# Patient Record
Sex: Female | Born: 1980 | Race: Black or African American | Hispanic: No | Marital: Single | Smoking: Never smoker
Health system: Southern US, Community
[De-identification: ages and names within clinical notes are randomized; demographics above are authoritative.]

## PROBLEM LIST (undated history)

## (undated) DIAGNOSIS — F419 Anxiety disorder, unspecified: Secondary | ICD-10-CM

## (undated) DIAGNOSIS — F32A Depression, unspecified: Secondary | ICD-10-CM

## (undated) DIAGNOSIS — G510 Bell's palsy: Secondary | ICD-10-CM

## (undated) DIAGNOSIS — D649 Anemia, unspecified: Secondary | ICD-10-CM

## (undated) DIAGNOSIS — M797 Fibromyalgia: Secondary | ICD-10-CM

## (undated) DIAGNOSIS — F329 Major depressive disorder, single episode, unspecified: Secondary | ICD-10-CM

## (undated) DIAGNOSIS — I517 Cardiomegaly: Secondary | ICD-10-CM

---
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
## 2010-01-04 ENCOUNTER — Emergency Department (HOSPITAL_BASED_OUTPATIENT_CLINIC_OR_DEPARTMENT_OTHER): Admission: EM | Admit: 2010-01-04 | Discharge: 2010-01-04 | Payer: Self-pay | Admitting: Emergency Medicine

## 2010-01-04 ENCOUNTER — Ambulatory Visit: Payer: Self-pay | Admitting: Diagnostic Radiology

## 2010-02-15 ENCOUNTER — Emergency Department (HOSPITAL_BASED_OUTPATIENT_CLINIC_OR_DEPARTMENT_OTHER): Admission: EM | Admit: 2010-02-15 | Discharge: 2010-02-16 | Payer: Self-pay | Admitting: Emergency Medicine

## 2010-05-06 ENCOUNTER — Emergency Department (HOSPITAL_COMMUNITY): Admission: EM | Admit: 2010-05-06 | Discharge: 2010-05-06 | Payer: Self-pay | Admitting: Emergency Medicine

## 2010-06-21 ENCOUNTER — Emergency Department (HOSPITAL_COMMUNITY): Admission: EM | Admit: 2010-06-21 | Discharge: 2010-06-21 | Payer: Self-pay | Admitting: Emergency Medicine

## 2010-07-26 ENCOUNTER — Emergency Department (HOSPITAL_COMMUNITY): Admission: EM | Admit: 2010-07-26 | Discharge: 2010-07-27 | Payer: Self-pay | Admitting: Emergency Medicine

## 2010-12-03 ENCOUNTER — Inpatient Hospital Stay (HOSPITAL_COMMUNITY)
Admission: RE | Admit: 2010-12-03 | Discharge: 2010-12-06 | DRG: 885 | Disposition: A | Discharge status: Home / Self Care | Payer: Medicaid Other | Attending: Psychiatry | Admitting: Psychiatry

## 2010-12-03 DIAGNOSIS — R45851 Suicidal ideations: Secondary | ICD-10-CM

## 2010-12-03 DIAGNOSIS — F322 Major depressive disorder, single episode, severe without psychotic features: Principal | ICD-10-CM

## 2010-12-03 DIAGNOSIS — IMO0001 Reserved for inherently not codable concepts without codable children: Secondary | ICD-10-CM

## 2010-12-03 DIAGNOSIS — E669 Obesity, unspecified: Secondary | ICD-10-CM

## 2010-12-03 DIAGNOSIS — G4700 Insomnia, unspecified: Secondary | ICD-10-CM

## 2010-12-04 LAB — CBC
HCT: 34.2 % — ABNORMAL LOW (ref 36.0–46.0)
Hemoglobin: 11.3 g/dL — ABNORMAL LOW (ref 12.0–15.0)
RBC: 4.15 MIL/uL (ref 3.87–5.11)
WBC: 6.3 10*3/uL (ref 4.0–10.5)

## 2010-12-04 LAB — URINALYSIS, ROUTINE W REFLEX MICROSCOPIC
Hgb urine dipstick: NEGATIVE
Specific Gravity, Urine: 1.031 — ABNORMAL HIGH (ref 1.005–1.030)
Urine Glucose, Fasting: NEGATIVE mg/dL
pH: 7 (ref 5.0–8.0)

## 2010-12-04 LAB — COMPREHENSIVE METABOLIC PANEL
ALT: 11 U/L (ref 0–35)
AST: 17 U/L (ref 0–37)
Alkaline Phosphatase: 77 U/L (ref 39–117)
CO2: 26 mEq/L (ref 19–32)
GFR calc Af Amer: >60 mL/min (ref >60)
GFR calc non Af Amer: >60 mL/min (ref >60)
Glucose, Bld: 107 mg/dL — ABNORMAL HIGH (ref 70–99)
Potassium: 3.7 mEq/L (ref 3.5–5.1)
Sodium: 138 mEq/L (ref 135–145)

## 2010-12-04 LAB — PREGNANCY, URINE: Preg Test, Ur: NEGATIVE

## 2010-12-05 DIAGNOSIS — F39 Unspecified mood [affective] disorder: Secondary | ICD-10-CM

## 2010-12-05 LAB — URINE DRUGS OF ABUSE SCREEN W ALC, ROUTINE (REF LAB)
Barbiturate Quant, Ur: NEGATIVE
Benzodiazepines.: NEGATIVE
Ethyl Alcohol: <10 mg/dL (ref <10)
Marijuana Metabolite: NEGATIVE
Opiate Screen, Urine: NEGATIVE
Phencyclidine (PCP): NEGATIVE
Propoxyphene: NEGATIVE

## 2010-12-05 LAB — TSH: TSH: 0.619 u[IU]/mL (ref 0.350–4.500)

## 2010-12-06 LAB — FOLATE: Folate: 9.5 ng/mL

## 2010-12-09 NOTE — Discharge Summary (Signed)
  Joann Peters, Joann Peters               ACCOUNT NO.:  [id number]  MEDICAL RECORD NO.:  [id number]           PATIENT TYPE:  LOCATION:  0507                          FACILITY:  [facility]  PHYSICIAN:  Marlis Edelson, DO        DATE OF BIRTH:  Jun 24, 1981  DATE OF ADMISSION:  12/03/2010 DATE OF DISCHARGE:  12/06/2010                              DISCHARGE SUMMARY   REASON FOR ADMISSION:  The patient reported with depressive symptoms of suicidal thoughts, feeling very tired, not wanting to live, just tired of life, not sleeping, losing her appetite.  Had been on antidepressants at home. Trying to be independent. Her significant stressors raising 6 children on her own and trying to go to school.  One of her children has a history of autism.  FINAL DIAGNOSES:  AXIS I:  Major depressive disorder, severe. AXIS II:  Deferred. AXIS III:  Fibromyalgia. AXIS IV:  Problems with education, psychosocial stressors. AXIS V:  55-60.  LABORATORY DATA:  Shows a folate level of 9.5, vitamin B12 269, total iron 9.5.  UDS is negative.  TSH of 0.619.  CMP within normal limits. CBC shows a hemoglobin of 11.3, hematocrit of 34.2.  Pregnancy test is negative.  CONDITION OF PATIENT:  The patient was integrated to the adult milieu. We started her back on her Cymbalta 30 mg and reinforced med compliance. Also increased her BuSpar 10 mg t.i.d. which she was already receiving some benefit.  We encouraged groups.  We added trazodone for sleep as the patient was having difficulty with insomnia.  Monitored her lab work.  Counselor spoke with patient and discussed support systems.  The patient improved and was denying any suicidal thoughts. She felt more hopeful.  Realized that she had more support than  she realized.  CONDITION ON DISCHARGE:  The patient was fully alert and cooperative with good eye contact, coherent, asking appropriate questions.  Felt that she would initially stay with her mother with continued support  and take advantage of her support that offered help with her children.  She was doing well on her medications.  She showed no signs of mania, hypomania or overt anxiety. Denied any suicidal, homicidal or psychotic symptoms.  DISCHARGE MEDICATIONS: 1. Cymbalta 30 mg one daily. 2. BuSpar 10 mg one tablet t.i.d. 3. Ambien for sleep. 4. Ferrous sulfate 325 one tablet daily.  DISCHARGE INSTRUCTIONS: 1. Her follow up appointment is at the [facility] on Thursday,     December 10, 2010 at 2 p.m. 2. Has counseling at the [facility] on Tuesday, December 17, 2010 at     phone number [phone]. 3. The patient was advised if any thoughts of suicidal or homicidal     ideation, or psychotic symptoms, to return to the emergency     department.     Landry Corporal, N.P.   ______________________________ Marlis Edelson, DO    JO/MEDQ  D:  12/06/2010  T:  12/06/2010  Job:  [id number]  Electronically Signed by Limmie PatriciaP. on 12/09/2010 02:02:26 PM Electronically Signed by Marlis Edelson MD on 12/09/2010 08:10:26 PM

## 2010-12-25 LAB — RAPID STREP SCREEN (MED CTR MEBANE ONLY): Streptococcus, Group A Screen (Direct): NEGATIVE

## 2010-12-26 LAB — URINALYSIS, ROUTINE W REFLEX MICROSCOPIC
Bilirubin Urine: NEGATIVE
Glucose, UA: NEGATIVE mg/dL
Hgb urine dipstick: NEGATIVE
Ketones, ur: NEGATIVE mg/dL
Nitrite: NEGATIVE
Protein, ur: NEGATIVE mg/dL
Specific Gravity, Urine: 1.018 (ref 1.005–1.030)
Urobilinogen, UA: 0.2 mg/dL (ref 0.0–1.0)
pH: 6 (ref 5.0–8.0)

## 2010-12-26 LAB — POCT PREGNANCY, URINE: Preg Test, Ur: NEGATIVE

## 2010-12-31 LAB — URINALYSIS, ROUTINE W REFLEX MICROSCOPIC
Ketones, ur: 15 mg/dL — AB
Nitrite: NEGATIVE
Protein, ur: NEGATIVE mg/dL
Urobilinogen, UA: 1 mg/dL (ref 0.0–1.0)

## 2010-12-31 LAB — URINE MICROSCOPIC-ADD ON

## 2010-12-31 LAB — PREGNANCY, URINE: Preg Test, Ur: NEGATIVE

## 2011-01-06 LAB — BASIC METABOLIC PANEL
CO2: 28 mEq/L (ref 19–32)
Calcium: 8.9 mg/dL (ref 8.4–10.5)
Chloride: 109 mEq/L (ref 96–112)
GFR calc Af Amer: >60 mL/min (ref >60)
Glucose, Bld: 94 mg/dL (ref 70–99)
Potassium: 3.6 mEq/L (ref 3.5–5.1)
Sodium: 144 mEq/L (ref 135–145)

## 2011-01-06 LAB — DIFFERENTIAL
Basophils Relative: 1 % (ref 0–1)
Eosinophils Relative: 1 % (ref 0–5)
Monocytes Absolute: 0.4 10*3/uL (ref 0.1–1.0)
Monocytes Relative: 6 % (ref 3–12)
Neutro Abs: 3.7 10*3/uL (ref 1.7–7.7)

## 2011-01-06 LAB — CBC
HCT: 33.3 % — ABNORMAL LOW (ref 36.0–46.0)
Hemoglobin: 11.7 g/dL — ABNORMAL LOW (ref 12.0–15.0)
MCHC: 35.2 g/dL (ref 30.0–36.0)
RBC: 4.02 MIL/uL (ref 3.87–5.11)

## 2011-02-05 ENCOUNTER — Emergency Department (INDEPENDENT_AMBULATORY_CARE_PROVIDER_SITE_OTHER): Payer: Medicaid Other

## 2011-02-05 ENCOUNTER — Emergency Department (HOSPITAL_BASED_OUTPATIENT_CLINIC_OR_DEPARTMENT_OTHER)
Admission: EM | Admit: 2011-02-05 | Discharge: 2011-02-05 | Disposition: A | Discharge status: Home / Self Care | Payer: Medicaid Other | Attending: Emergency Medicine | Admitting: Emergency Medicine

## 2011-02-05 DIAGNOSIS — M79609 Pain in unspecified limb: Secondary | ICD-10-CM

## 2011-02-05 DIAGNOSIS — N760 Acute vaginitis: Secondary | ICD-10-CM | POA: Insufficient documentation

## 2011-02-05 DIAGNOSIS — A499 Bacterial infection, unspecified: Secondary | ICD-10-CM | POA: Insufficient documentation

## 2011-02-05 DIAGNOSIS — B9689 Other specified bacterial agents as the cause of diseases classified elsewhere: Secondary | ICD-10-CM | POA: Insufficient documentation

## 2011-02-05 DIAGNOSIS — Y92009 Unspecified place in unspecified non-institutional (private) residence as the place of occurrence of the external cause: Secondary | ICD-10-CM | POA: Insufficient documentation

## 2011-02-05 DIAGNOSIS — N390 Urinary tract infection, site not specified: Secondary | ICD-10-CM | POA: Insufficient documentation

## 2011-02-05 DIAGNOSIS — W1809XA Striking against other object with subsequent fall, initial encounter: Secondary | ICD-10-CM | POA: Insufficient documentation

## 2011-02-05 DIAGNOSIS — W010XXA Fall on same level from slipping, tripping and stumbling without subsequent striking against object, initial encounter: Secondary | ICD-10-CM

## 2011-02-05 LAB — URINE MICROSCOPIC-ADD ON

## 2011-02-05 LAB — WET PREP, GENITAL
Trich, Wet Prep: NONE SEEN
Yeast Wet Prep HPF POC: NONE SEEN

## 2011-02-05 LAB — URINALYSIS, ROUTINE W REFLEX MICROSCOPIC
Bilirubin Urine: NEGATIVE
Glucose, UA: NEGATIVE mg/dL
Hgb urine dipstick: NEGATIVE
Protein, ur: NEGATIVE mg/dL
Specific Gravity, Urine: 1.031 — ABNORMAL HIGH (ref 1.005–1.030)

## 2011-02-05 LAB — PREGNANCY, URINE: Preg Test, Ur: NEGATIVE

## 2011-02-06 LAB — GC/CHLAMYDIA PROBE AMP, GENITAL: GC Probe Amp, Genital: NEGATIVE

## 2011-02-07 ENCOUNTER — Emergency Department (INDEPENDENT_AMBULATORY_CARE_PROVIDER_SITE_OTHER): Payer: Medicaid Other

## 2011-02-07 ENCOUNTER — Emergency Department (HOSPITAL_BASED_OUTPATIENT_CLINIC_OR_DEPARTMENT_OTHER)
Admission: EM | Admit: 2011-02-07 | Discharge: 2011-02-07 | Disposition: A | Discharge status: Home / Self Care | Payer: Medicaid Other | Attending: Emergency Medicine | Admitting: Emergency Medicine

## 2011-02-07 DIAGNOSIS — N390 Urinary tract infection, site not specified: Secondary | ICD-10-CM | POA: Insufficient documentation

## 2011-02-07 DIAGNOSIS — R0602 Shortness of breath: Secondary | ICD-10-CM | POA: Insufficient documentation

## 2011-02-07 DIAGNOSIS — A499 Bacterial infection, unspecified: Secondary | ICD-10-CM | POA: Insufficient documentation

## 2011-02-07 DIAGNOSIS — R079 Chest pain, unspecified: Secondary | ICD-10-CM

## 2011-02-07 DIAGNOSIS — F329 Major depressive disorder, single episode, unspecified: Secondary | ICD-10-CM | POA: Insufficient documentation

## 2011-02-07 DIAGNOSIS — B9689 Other specified bacterial agents as the cause of diseases classified elsewhere: Secondary | ICD-10-CM | POA: Insufficient documentation

## 2011-02-07 DIAGNOSIS — F3289 Other specified depressive episodes: Secondary | ICD-10-CM | POA: Insufficient documentation

## 2011-02-07 DIAGNOSIS — N760 Acute vaginitis: Secondary | ICD-10-CM | POA: Insufficient documentation

## 2011-04-10 ENCOUNTER — Emergency Department (HOSPITAL_BASED_OUTPATIENT_CLINIC_OR_DEPARTMENT_OTHER): Payer: Medicaid Other

## 2011-04-10 ENCOUNTER — Emergency Department (HOSPITAL_BASED_OUTPATIENT_CLINIC_OR_DEPARTMENT_OTHER)
Admission: EM | Admit: 2011-04-10 | Discharge: 2011-04-10 | Discharge status: Against Medical Advice | Payer: Medicaid Other | Attending: Emergency Medicine | Admitting: Emergency Medicine

## 2011-04-10 DIAGNOSIS — N898 Other specified noninflammatory disorders of vagina: Secondary | ICD-10-CM | POA: Insufficient documentation

## 2011-04-10 LAB — PREGNANCY, URINE: Preg Test, Ur: NEGATIVE

## 2011-04-10 LAB — WET PREP, GENITAL: WBC, Wet Prep HPF POC: NONE SEEN

## 2011-04-11 LAB — GC/CHLAMYDIA PROBE AMP, GENITAL
Chlamydia, DNA Probe: NEGATIVE
GC Probe Amp, Genital: NEGATIVE

## 2011-04-14 ENCOUNTER — Emergency Department (INDEPENDENT_AMBULATORY_CARE_PROVIDER_SITE_OTHER): Payer: Medicaid Other

## 2011-04-14 ENCOUNTER — Emergency Department (HOSPITAL_BASED_OUTPATIENT_CLINIC_OR_DEPARTMENT_OTHER)
Admission: EM | Admit: 2011-04-14 | Discharge: 2011-04-14 | Disposition: A | Discharge status: Home / Self Care | Payer: Medicaid Other | Attending: Emergency Medicine | Admitting: Emergency Medicine

## 2011-04-14 DIAGNOSIS — N898 Other specified noninflammatory disorders of vagina: Secondary | ICD-10-CM

## 2011-04-14 DIAGNOSIS — R109 Unspecified abdominal pain: Secondary | ICD-10-CM | POA: Insufficient documentation

## 2011-04-14 LAB — URINALYSIS, ROUTINE W REFLEX MICROSCOPIC
Bilirubin Urine: NEGATIVE
Glucose, UA: NEGATIVE mg/dL
Hgb urine dipstick: NEGATIVE
Protein, ur: NEGATIVE mg/dL
Urobilinogen, UA: 0.2 mg/dL (ref 0.0–1.0)

## 2011-09-11 ENCOUNTER — Emergency Department (HOSPITAL_BASED_OUTPATIENT_CLINIC_OR_DEPARTMENT_OTHER)
Admission: EM | Admit: 2011-09-11 | Discharge: 2011-09-11 | Discharge status: Against Medical Advice | Payer: Medicaid Other | Attending: Emergency Medicine | Admitting: Emergency Medicine

## 2011-09-11 ENCOUNTER — Encounter: Payer: Self-pay | Admitting: *Deleted

## 2011-09-11 DIAGNOSIS — N920 Excessive and frequent menstruation with regular cycle: Secondary | ICD-10-CM | POA: Insufficient documentation

## 2011-09-11 HISTORY — DX: Anemia, unspecified: D64.9

## 2011-09-11 NOTE — ED Notes (Signed)
Two periods this month lasting 7 to 8 days each states she is using 5-7 pads per day has been seen for same before

## 2012-07-30 ENCOUNTER — Emergency Department (HOSPITAL_BASED_OUTPATIENT_CLINIC_OR_DEPARTMENT_OTHER)
Admission: EM | Admit: 2012-07-30 | Discharge: 2012-07-30 | Disposition: A | Discharge status: Home / Self Care | Payer: Self-pay | Attending: Emergency Medicine | Admitting: Emergency Medicine

## 2012-07-30 ENCOUNTER — Encounter (HOSPITAL_BASED_OUTPATIENT_CLINIC_OR_DEPARTMENT_OTHER): Payer: Self-pay | Admitting: *Deleted

## 2012-07-30 DIAGNOSIS — N946 Dysmenorrhea, unspecified: Secondary | ICD-10-CM | POA: Insufficient documentation

## 2012-07-30 DIAGNOSIS — B9689 Other specified bacterial agents as the cause of diseases classified elsewhere: Secondary | ICD-10-CM | POA: Insufficient documentation

## 2012-07-30 DIAGNOSIS — N760 Acute vaginitis: Secondary | ICD-10-CM | POA: Insufficient documentation

## 2012-07-30 DIAGNOSIS — N920 Excessive and frequent menstruation with regular cycle: Secondary | ICD-10-CM | POA: Insufficient documentation

## 2012-07-30 DIAGNOSIS — A499 Bacterial infection, unspecified: Secondary | ICD-10-CM | POA: Insufficient documentation

## 2012-07-30 LAB — CBC
HCT: 31.6 % — ABNORMAL LOW (ref 36.0–46.0)
Hemoglobin: 11 g/dL — ABNORMAL LOW (ref 12.0–15.0)
MCH: 27.9 pg (ref 26.0–34.0)
MCHC: 34.8 g/dL (ref 30.0–36.0)
RBC: 3.94 MIL/uL (ref 3.87–5.11)

## 2012-07-30 LAB — URINALYSIS, ROUTINE W REFLEX MICROSCOPIC
Glucose, UA: NEGATIVE mg/dL
Ketones, ur: NEGATIVE mg/dL
Protein, ur: NEGATIVE mg/dL
pH: 6 (ref 5.0–8.0)

## 2012-07-30 LAB — WET PREP, GENITAL
Trich, Wet Prep: NONE SEEN
Yeast Wet Prep HPF POC: NONE SEEN

## 2012-07-30 LAB — PREGNANCY, URINE: Preg Test, Ur: NEGATIVE

## 2012-07-30 LAB — URINE MICROSCOPIC-ADD ON

## 2012-07-30 MED ORDER — MEDROXYPROGESTERONE ACETATE 5 MG PO TABS
5.0000 mg | ORAL_TABLET | Freq: Every day | ORAL | Status: AC
Start: 2012-07-30 — End: ?

## 2012-07-30 MED ORDER — HYDROCODONE-ACETAMINOPHEN 5-325 MG PO TABS: 1.0000 | ORAL_TABLET | Freq: Four times a day (QID) | ORAL | Status: AC | PRN

## 2012-07-30 MED ORDER — IBUPROFEN 800 MG PO TABS
800.0000 mg | ORAL_TABLET | Freq: Once | ORAL | Status: AC
  Administered 2012-07-30: 800 mg via ORAL
  Filled 2012-07-30: qty 1

## 2012-07-30 MED ORDER — IBUPROFEN 800 MG PO TABS: 800.0000 mg | ORAL_TABLET | Freq: Three times a day (TID) | ORAL | Status: AC | PRN

## 2012-07-30 NOTE — ED Notes (Signed)
MD at bedside giving test results and dispo plan.  

## 2012-07-30 NOTE — ED Notes (Signed)
Started period yesterday having severe pain in legs back and low abdomen with clots reports has been very tired for 2 days and heavy bleeding states used 4 pads today

## 2012-07-30 NOTE — ED Notes (Signed)
MD at bedside. 

## 2012-07-30 NOTE — ED Notes (Signed)
Patient unable to provide urine specimen at this time. Patient in room 7, undressed, and given instructions when she has to go to the restroom

## 2012-07-30 NOTE — ED Provider Notes (Signed)
History     CSN: [id number]  Arrival date & time 07/30/12  1847   First MD Initiated Contact with Patient 07/30/12 1851      Chief Complaint  Patient presents with  . menstrual cramps     (Consider location/radiation/quality/duration/timing/severity/associated sxs/prior treatment) HPI Pt with history of dysmenorrhea, has been evaluated in the ED several times previously for same including US done about 15 months ago which was neg. Reports she began having some cramping abdomen and back pain yesterday prior to the onset of her menses today. Reports she has been having heavy vaginal bleeding, severe lower abdominal cramping. No fever. Some nausea no vomiting. No dysuria.  Past Medical History  Diagnosis Date  . Anemia     History reviewed. No pertinent past surgical history.  History reviewed. No pertinent family history.  History  Substance Use Topics  . Smoking status: Never Smoker   . Smokeless tobacco: Not on file  . Alcohol Use: No    OB History    Grav Para Term Preterm Abortions TAB SAB Ect Mult Living                  Review of Systems All other systems reviewed and are negative except as noted in HPI.   Allergies  Percocet  Home Medications  No current outpatient prescriptions on file.  BP 123/85  Pulse 71  Temp 98.2 F (36.8 C) (Oral)  Resp 16  Ht 5\' 2"  (1.575 m)  Wt 205 lb (92.987 kg)  BMI 37.49 kg/m2  SpO2 100%  Physical Exam  Nursing note and vitals reviewed. Constitutional: She is oriented to person, place, and time. She appears well-developed and well-nourished.  HENT:  Head: Normocephalic and atraumatic.  Eyes: EOM are normal. Pupils are equal, round, and reactive to light.  Neck: Normal range of motion. Neck supple.  Cardiovascular: Normal rate, normal heart sounds and intact distal pulses.   Pulmonary/Chest: Effort normal and breath sounds normal.  Abdominal: Bowel sounds are normal. She exhibits no distension and no mass. There is  tenderness (diffuse lower abdominal tenderness). There is no rebound and no guarding.  Genitourinary: Uterus is tender. Uterus is not enlarged. Cervix exhibits no motion tenderness and no discharge. Right adnexum displays no tenderness and no fullness. Left adnexum displays no tenderness and no fullness. There is bleeding around the vagina. No foreign body around the vagina. No vaginal discharge found.  Musculoskeletal: Normal range of motion. She exhibits no edema and no tenderness.  Neurological: She is alert and oriented to person, place, and time. She has normal strength. No cranial nerve deficit or sensory deficit.  Skin: Skin is warm and dry. No rash noted.  Psychiatric: She has a normal mood and affect.    ED Course  Procedures (including critical care time)  Labs Reviewed  URINALYSIS, ROUTINE W REFLEX MICROSCOPIC - Abnormal; Notable for the following:    APPearance CLOUDY (*)     Hgb urine dipstick LARGE (*)     All other components within normal limits  CBC - Abnormal; Notable for the following:    Hemoglobin 11.0 (*)     HCT 31.6 (*)     All other components within normal limits  WET PREP, GENITAL - Abnormal; Notable for the following:    Clue Cells Wet Prep HPF POC MODERATE (*)     WBC, Wet Prep HPF POC RARE (*)     All other components within normal limits  URINE MICROSCOPIC-ADD ON - Abnormal;  Notable for the following:    Squamous Epithelial / LPF FEW (*)     All other components within normal limits  PREGNANCY, URINE  GC/CHLAMYDIA PROBE AMP, GENITAL   No results found.   No diagnosis found.    MDM  Pt with dysmenorrhea and menorrhagia. Also BV on wet prep. Will treat with Provera, pain meds as needed. Flagyl and Gyn followup.         Makeshia Seat B. Bernette Mayers, MD 07/30/12 2017

## 2012-07-31 LAB — GC/CHLAMYDIA PROBE AMP, GENITAL
Chlamydia, DNA Probe: NEGATIVE
GC Probe Amp, Genital: NEGATIVE

## 2012-08-02 ENCOUNTER — Encounter (HOSPITAL_BASED_OUTPATIENT_CLINIC_OR_DEPARTMENT_OTHER): Payer: Self-pay | Admitting: *Deleted

## 2012-08-02 ENCOUNTER — Other Ambulatory Visit: Payer: Self-pay

## 2012-08-02 ENCOUNTER — Emergency Department (HOSPITAL_BASED_OUTPATIENT_CLINIC_OR_DEPARTMENT_OTHER)
Admission: EM | Admit: 2012-08-02 | Discharge: 2012-08-02 | Discharge status: Against Medical Advice | Payer: Self-pay | Attending: Emergency Medicine | Admitting: Emergency Medicine

## 2012-08-02 DIAGNOSIS — R079 Chest pain, unspecified: Secondary | ICD-10-CM | POA: Insufficient documentation

## 2012-08-02 DIAGNOSIS — Z862 Personal history of diseases of the blood and blood-forming organs and certain disorders involving the immune mechanism: Secondary | ICD-10-CM | POA: Insufficient documentation

## 2012-08-02 NOTE — ED Notes (Signed)
Pt c/o central chest pain which radiated down right arm  Also c/o  nausea

## 2012-08-02 NOTE — ED Notes (Signed)
Called to treatment room with no answer from lobby 

## 2012-08-03 NOTE — ED Provider Notes (Signed)
Date: 08/03/2012  Rate: 64  Rhythm: normal sinus rhythm and sinus arrhythmia  QRS Axis: normal  Intervals: normal  ST/T Wave abnormalities: normal  Conduction Disutrbances:none  Narrative Interpretation: Normal EKG  Old EKG Reviewed: unchanged    Carleene Cooper III, MD 08/03/12 [phone]

## 2013-11-23 ENCOUNTER — Encounter (HOSPITAL_BASED_OUTPATIENT_CLINIC_OR_DEPARTMENT_OTHER): Payer: Self-pay | Admitting: Emergency Medicine

## 2013-11-23 ENCOUNTER — Emergency Department (HOSPITAL_BASED_OUTPATIENT_CLINIC_OR_DEPARTMENT_OTHER)
Admission: EM | Admit: 2013-11-23 | Discharge: 2013-11-24 | Disposition: A | Discharge status: Home / Self Care | Payer: Medicaid Other | Attending: Emergency Medicine | Admitting: Emergency Medicine

## 2013-11-23 DIAGNOSIS — M25519 Pain in unspecified shoulder: Secondary | ICD-10-CM | POA: Insufficient documentation

## 2013-11-23 DIAGNOSIS — F411 Generalized anxiety disorder: Secondary | ICD-10-CM | POA: Insufficient documentation

## 2013-11-23 DIAGNOSIS — R6883 Chills (without fever): Secondary | ICD-10-CM | POA: Insufficient documentation

## 2013-11-23 DIAGNOSIS — R071 Chest pain on breathing: Secondary | ICD-10-CM | POA: Insufficient documentation

## 2013-11-23 DIAGNOSIS — R110 Nausea: Secondary | ICD-10-CM | POA: Insufficient documentation

## 2013-11-23 DIAGNOSIS — D649 Anemia, unspecified: Secondary | ICD-10-CM | POA: Insufficient documentation

## 2013-11-23 DIAGNOSIS — R079 Chest pain, unspecified: Secondary | ICD-10-CM

## 2013-11-23 DIAGNOSIS — M546 Pain in thoracic spine: Secondary | ICD-10-CM | POA: Insufficient documentation

## 2013-11-23 DIAGNOSIS — Z79899 Other long term (current) drug therapy: Secondary | ICD-10-CM | POA: Insufficient documentation

## 2013-11-23 DIAGNOSIS — Z8679 Personal history of other diseases of the circulatory system: Secondary | ICD-10-CM | POA: Insufficient documentation

## 2013-11-23 HISTORY — DX: Cardiomegaly: I51.7

## 2013-11-23 HISTORY — DX: Anxiety disorder, unspecified: F41.9

## 2013-11-23 NOTE — ED Notes (Signed)
C/o left side CP radiates to left arm-started last night-nausea, SOB-[facility]-pt NAD at this time

## 2013-11-24 NOTE — ED Provider Notes (Signed)
CSN: [id number]     Arrival date & time 11/23/13  2212 History   First MD Initiated Contact with Patient 11/24/13 0024     Chief Complaint  Patient presents with  . Chest Pain     (Consider location/radiation/quality/duration/timing/severity/associated sxs/prior Treatment) HPI This is a 33 year old female complains of chest pain for over 24 hours. The pain is located in her left upper chest and is described as a tightness. It radiated to her entire body the night before last and made her feel like she was sweating all over even though she was not sweating. She is also having a burning pain in her left shoulder that radiates to her left upper arm and her left upper back. The pain is worse with palpation or movement. It is associated with shortness of breath, chills and nausea. She was seen by her primary care physician yesterday morning who diagnosed her with "inflammation" and called an unspecified medication for her. Rather than get the medication filled she [facility] per she stayed from 2 PM to 7 PM. She is not sure of the specific diagnosis she was given but states that "everything looked okay" except some occasional irregular beats. She was noted to be bradycardic on arrival. She is complaining of feeling chilled the present time.  Past Medical History  Diagnosis Date  . Anemia   . Anxiety   . Enlarged heart    History reviewed. No pertinent past surgical history. No family history on file. History  Substance Use Topics  . Smoking status: Never Smoker   . Smokeless tobacco: Not on file  . Alcohol Use: No   OB History   Grav Para Term Preterm Abortions TAB SAB Ect Mult Living                 Review of Systems  All other systems reviewed and are negative.      Allergies  Percocet  Home Medications   Current Outpatient Rx  Name  Route  Sig  Dispense  Refill  . ALPRAZolam (XANAX) 0.5 MG tablet   Oral   Take 0.5 mg by mouth at bedtime as needed for  anxiety.         . IRON PO   Oral   Take by mouth.         Marland Kitchen. HYDROcodone-acetaminophen (NORCO/VICODIN) 5-325 MG per tablet   Oral   Take 1-2 tablets by mouth every 6 (six) hours as needed for pain.   30 tablet   0   . ibuprofen (ADVIL,MOTRIN) 800 MG tablet   Oral   Take 1 tablet (800 mg total) by mouth every 8 (eight) hours as needed for pain.   30 tablet   0   . medroxyPROGESTERone (PROVERA) 5 MG tablet   Oral   Take 1 tablet (5 mg total) by mouth daily.   5 tablet   0    BP 107/78  Pulse 54  Temp(Src) 98.5 F (36.9 C) (Oral)  Resp 18  Ht 5\' 3"  (1.6 m)  Wt 173 lb (78.472 kg)  BMI 30.65 kg/m2  SpO2 100%  LMP 10/24/2013  Physical Exam General: Well-developed, well-nourished female in no acute distress; appearance consistent with age of record HENT: normocephalic; atraumatic Eyes: pupils equal, round and reactive to light; extraocular muscles intact Neck: supple Heart: regular rate and rhythm; no murmurs, rubs or gallops Lungs: clear to auscultation bilaterally Chest: Left upper chest wall tenderness that reproduces the pain of the chief  complaint Abdomen: soft; nondistended; nontender; no masses or hepatosplenomegaly; bowel sounds present Extremities: No deformity; full range of motion; pulses normal; pain on range of motion of left shoulder, reproducing pain in the left upper back and shoulder Neurologic: Awake, alert and oriented; motor function intact in all extremities and symmetric; no facial droop Skin: Warm and dry Psychiatric: Anxious    ED Course  Procedures (including critical care time) 12:59 AM Patient is now refusing any further evaluation or treatment. She wishes to be discharged so that she can return to [facility] where she will seek admission.    Carlisle Beers Nakya Weyand, MD 11/24/13 0100

## 2013-11-24 NOTE — Discharge Instructions (Signed)
Chest Pain (Nonspecific) °It is often hard to give a specific diagnosis for the cause of chest pain. There is always a chance that your pain could be related to something serious, such as a heart attack or a blood clot in the lungs. You need to follow up with your caregiver for further evaluation. °CAUSES  °· Heartburn. °· Pneumonia or bronchitis. °· Anxiety or stress. °· Inflammation around your heart (pericarditis) or lung (pleuritis or pleurisy). °· A blood clot in the lung. °· A collapsed lung (pneumothorax). It can develop suddenly on its own (spontaneous pneumothorax) or from injury (trauma) to the chest. °· Shingles infection (herpes zoster virus). °The chest wall is composed of bones, muscles, and cartilage. Any of these can be the source of the pain. °· The bones can be bruised by injury. °· The muscles or cartilage can be strained by coughing or overwork. °· The cartilage can be affected by inflammation and become sore (costochondritis). °DIAGNOSIS  °Lab tests or other studies, such as X-rays, electrocardiography, stress testing, or cardiac imaging, may be needed to find the cause of your pain.  °TREATMENT  °· Treatment depends on what may be causing your chest pain. Treatment may include: °· Acid blockers for heartburn. °· Anti-inflammatory medicine. °· Pain medicine for inflammatory conditions. °· Antibiotics if an infection is present. °· You may be advised to change lifestyle habits. This includes stopping smoking and avoiding alcohol, caffeine, and chocolate. °· You may be advised to keep your head raised (elevated) when sleeping. This reduces the chance of acid going backward from your stomach into your esophagus. °· Most of the time, nonspecific chest pain will improve within 2 to 3 days with rest and mild pain medicine. °HOME CARE INSTRUCTIONS  °· If antibiotics were prescribed, take your antibiotics as directed. Finish them even if you start to feel better. °· For the next few days, avoid physical  activities that bring on chest pain. Continue physical activities as directed. °· Do not smoke. °· Avoid drinking alcohol. °· Only take over-the-counter or prescription medicine for pain, discomfort, or fever as directed by your caregiver. °· Follow your caregiver's suggestions for further testing if your chest pain does not go away. °· Keep any follow-up appointments you made. If you do not go to an appointment, you could develop lasting (chronic) problems with pain. If there is any problem keeping an appointment, you must call to reschedule. °SEEK MEDICAL CARE IF:  °· You think you are having problems from the medicine you are taking. Read your medicine instructions carefully. °· Your chest pain does not go away, even after treatment. °· You develop a rash with blisters on your chest. °SEEK IMMEDIATE MEDICAL CARE IF:  °· You have increased chest pain or pain that spreads to your arm, neck, jaw, back, or abdomen. °· You develop shortness of breath, an increasing cough, or you are coughing up blood. °· You have severe back or abdominal pain, feel nauseous, or vomit. °· You develop severe weakness, fainting, or chills. °· You have a fever. °THIS IS AN EMERGENCY. Do not wait to see if the pain will go away. Get medical help at once. Call your local emergency services (911 in [place] Do not drive yourself to the hospital. °MAKE SURE YOU:  °· Understand these instructions. °· Will watch your condition. °· Will get help right away if you are not doing well or get worse. °Document Released: 07/09/2005 Document Revised: 12/22/2011 Document Reviewed: 05/04/2008 °ExitCare® Patient Information ©2014 [employer]   LL[employer]. ° °

## 2014-08-21 ENCOUNTER — Emergency Department (HOSPITAL_BASED_OUTPATIENT_CLINIC_OR_DEPARTMENT_OTHER)
Admission: EM | Admit: 2014-08-21 | Discharge: 2014-08-21 | Disposition: A | Discharge status: Home / Self Care | Payer: Medicaid Other | Attending: Emergency Medicine | Admitting: Emergency Medicine

## 2014-08-21 ENCOUNTER — Encounter (HOSPITAL_BASED_OUTPATIENT_CLINIC_OR_DEPARTMENT_OTHER): Payer: Self-pay | Admitting: *Deleted

## 2014-08-21 DIAGNOSIS — H9209 Otalgia, unspecified ear: Secondary | ICD-10-CM | POA: Insufficient documentation

## 2014-08-21 DIAGNOSIS — Z79899 Other long term (current) drug therapy: Secondary | ICD-10-CM | POA: Diagnosis not present

## 2014-08-21 DIAGNOSIS — D649 Anemia, unspecified: Secondary | ICD-10-CM | POA: Diagnosis not present

## 2014-08-21 DIAGNOSIS — G510 Bell's palsy: Secondary | ICD-10-CM | POA: Diagnosis not present

## 2014-08-21 DIAGNOSIS — Z8679 Personal history of other diseases of the circulatory system: Secondary | ICD-10-CM | POA: Insufficient documentation

## 2014-08-21 DIAGNOSIS — H579 Unspecified disorder of eye and adnexa: Secondary | ICD-10-CM | POA: Diagnosis present

## 2014-08-21 DIAGNOSIS — F419 Anxiety disorder, unspecified: Secondary | ICD-10-CM | POA: Diagnosis not present

## 2014-08-21 MED ORDER — VALACYCLOVIR HCL 1 G PO TABS: 1000.0000 mg | ORAL_TABLET | Freq: Three times a day (TID) | ORAL | Status: AC

## 2014-08-21 MED ORDER — PREDNISONE 20 MG PO TABS: 60.0000 mg | ORAL_TABLET | Freq: Every day | ORAL | Status: AC

## 2014-08-21 NOTE — Discharge Instructions (Signed)
Bell's Palsy °Bell's palsy is a condition in which the muscles on one side of the face cannot move (paralysis). This is because the nerves in the face are paralyzed. It is most often thought to be caused by a virus. The virus causes swelling of the nerve that controls movement on one side of the face. The nerve travels through a tight space surrounded by bone. When the nerve swells, it can be compressed by the bone. This results in damage to the protective covering around the nerve. This damage interferes with how the nerve communicates with the muscles of the face. As a result, it can cause weakness or paralysis of the facial muscles.  °Injury (trauma), tumor, and surgery may cause Bell's palsy, but most of the time the cause is unknown. It is a relatively common condition. It starts suddenly (abrupt onset) with the paralysis usually ending within 2 days. Bell's palsy is not dangerous. But because the eye does not close properly, you may need care to keep the eye from getting dry. This can include splinting (to keep the eye shut) or moistening with artificial tears. Bell's palsy very seldom occurs on both sides of the face at the same time. °SYMPTOMS  °· Eyebrow sagging. °· Drooping of the eyelid and corner of the mouth. °· Inability to close one eye. °· Loss of taste on the front of the tongue. °· Sensitivity to loud noises. °TREATMENT  °The treatment is usually non-surgical. If the patient is seen within the first 24 to 48 hours, a short course of steroids may be prescribed, in an attempt to shorten the length of the condition. Antiviral medicines may also be used with the steroids, but it is unclear if they are helpful.  °You will need to protect your eye, if you cannot close it. The cornea (clear covering over your eye) will become dry and can be damaged. Artificial tears can be used to keep your eye moist. Glasses or an eye patch should be worn to protect your eye. °PROGNOSIS  °Recovery is variable, ranging  from days to months. Although the problem usually goes away completely (about 80% of cases resolve), predicting the outcome is impossible. Most people improve within 3 weeks of when the symptoms began. Improvement may continue for 3 to 6 months. A small number of people have moderate to severe weakness that is permanent.  °HOME CARE INSTRUCTIONS  °· If your caregiver prescribed medication to reduce swelling in the nerve, use as directed. Do not stop taking the medication unless directed by your caregiver. °· Use moisturizing eye drops as needed to prevent drying of your eye, as directed by your caregiver. °· Protect your eye, as directed by your caregiver. °· Use facial massage and exercises, as directed by your caregiver. °· Perform your normal activities, and get your normal rest. °SEEK IMMEDIATE MEDICAL CARE IF:  °· There is pain, redness or irritation in the eye. °· You or your child has an oral temperature above 102° F (38.9° C), not controlled by medicine. °MAKE SURE YOU:  °· Understand these instructions. °· Will watch your condition. °· Will get help right away if you are not doing well or get worse. °Document Released: 09/29/2005 Document Revised: 12/22/2011 Document Reviewed: 01/06/2014 °ExitCare® Patient Information ©2015 [employer]. This information is not intended to replace advice given to you by your health care provider. Make sure you discuss any questions you have with your health care provider. ° °

## 2014-08-21 NOTE — ED Notes (Signed)
NP at bedside.

## 2014-08-21 NOTE — ED Notes (Signed)
Pt c/o left eye droop since Sunday, denies injury. No swelling noted. Equal grip strength, ambulates with a steady gait.

## 2014-08-21 NOTE — ED Provider Notes (Signed)
CSN: [id number]     Arrival date & time 08/21/14  [id number] History   First MD Initiated Contact with [id number] 08/21/14 1009     Chief Complaint  Patient presents with  . Eye Problem     (Consider location/radiation/quality/duration/timing/severity/associated sxs/prior Treatment) HPI Comments: Pt states that 2 days ago she started with pain in her ear and cheek. Pt states that she noticed that things have tasted funny. She states that now she feels like her left eye doesn't want to close. She denies blurred vision. States that her tongue on that side feels "funny". Denies weakness, dizziness or ambulation issue. She states that she notice that her smile is not normal  The history is provided by the patient. No language interpreter was used.    Past Medical History  Diagnosis Date  . Anemia   . Anxiety   . Enlarged heart    History reviewed. No pertinent past surgical history. History reviewed. No pertinent family history. History  Substance Use Topics  . Smoking status: Never Smoker   . Smokeless tobacco: Not on file  . Alcohol Use: No   OB History    No data available     Review of Systems  All other systems reviewed and are negative.     Allergies  Percocet  Home Medications   Prior to Admission medications   Medication Sig Start Date End Date Taking? Authorizing Provider  ALPRAZolam Prudy Feeler(XANAX) 0.5 MG tablet Take 0.5 mg by mouth at bedtime as needed for anxiety.    Historical Provider, MD  HYDROcodone-acetaminophen (NORCO/VICODIN) 5-325 MG per tablet Take 1-2 tablets by mouth every 6 (six) hours as needed for pain. 07/30/12   Charles B. Bernette MayersSheldon, MD  ibuprofen (ADVIL,MOTRIN) 800 MG tablet Take 1 tablet (800 mg total) by mouth every 8 (eight) hours as needed for pain. 07/30/12   Charles B. Bernette MayersSheldon, MD  IRON PO Take by mouth.    Historical Provider, MD  medroxyPROGESTERone (PROVERA) 5 MG tablet Take 1 tablet (5 mg total) by mouth daily. 07/30/12   Charles B. Bernette MayersSheldon, MD   BP  115/82 mmHg  Temp(Src) 98 F (36.7 C) (Oral)  Resp 20  Ht 5\' 2"  (1.575 m)  Wt 160 lb (72.576 kg)  BMI 29.26 kg/m2  SpO2 100% Physical Exam  Constitutional: She is oriented to person, place, and time. She appears well-developed and well-nourished.  HENT:  Right Ear: External ear normal.  Left Ear: External ear normal.  Eyes: Conjunctivae and EOM are normal. Pupils are equal, round, and reactive to light.  Unable to close left eye completely, left side effected with smile  Neck: Neck supple.  Cardiovascular: Normal rate and regular rhythm.   Pulmonary/Chest: Effort normal.  Musculoskeletal: Normal range of motion.  Neurological: She is alert and oriented to person, place, and time. She exhibits normal muscle tone. Coordination normal.  Grip strength equal. Negative romberg  Skin: Skin is warm and dry.  Nursing note and vitals reviewed.   ED Course  Procedures (including critical care time) Labs Review Labs Reviewed - No data to display  Imaging Review No results found.   EKG Interpretation None      MDM   Final diagnoses:  Bell's palsy    Exam consistent with Bells palsy. Discussed with pt supportive care with eye and follow up with neurology.    Teressa LowerVrinda Boruch Manuele, NP 08/21/14 1038  Doug SouSam Jacubowitz, MD 08/21/14 1752

## 2014-09-01 ENCOUNTER — Emergency Department (HOSPITAL_BASED_OUTPATIENT_CLINIC_OR_DEPARTMENT_OTHER): Payer: Medicaid Other

## 2014-09-01 ENCOUNTER — Encounter (HOSPITAL_BASED_OUTPATIENT_CLINIC_OR_DEPARTMENT_OTHER): Payer: Self-pay | Admitting: *Deleted

## 2014-09-01 ENCOUNTER — Emergency Department (HOSPITAL_BASED_OUTPATIENT_CLINIC_OR_DEPARTMENT_OTHER)
Admission: EM | Admit: 2014-09-01 | Discharge: 2014-09-01 | Disposition: A | Discharge status: Home / Self Care | Payer: Medicaid Other | Attending: Emergency Medicine | Admitting: Emergency Medicine

## 2014-09-01 DIAGNOSIS — Z79899 Other long term (current) drug therapy: Secondary | ICD-10-CM | POA: Insufficient documentation

## 2014-09-01 DIAGNOSIS — F329 Major depressive disorder, single episode, unspecified: Secondary | ICD-10-CM | POA: Diagnosis not present

## 2014-09-01 DIAGNOSIS — M79604 Pain in right leg: Secondary | ICD-10-CM | POA: Insufficient documentation

## 2014-09-01 DIAGNOSIS — Z8669 Personal history of other diseases of the nervous system and sense organs: Secondary | ICD-10-CM | POA: Insufficient documentation

## 2014-09-01 DIAGNOSIS — Z7952 Long term (current) use of systemic steroids: Secondary | ICD-10-CM | POA: Insufficient documentation

## 2014-09-01 DIAGNOSIS — M791 Myalgia: Secondary | ICD-10-CM | POA: Insufficient documentation

## 2014-09-01 DIAGNOSIS — R079 Chest pain, unspecified: Secondary | ICD-10-CM | POA: Insufficient documentation

## 2014-09-01 DIAGNOSIS — F419 Anxiety disorder, unspecified: Secondary | ICD-10-CM | POA: Diagnosis not present

## 2014-09-01 DIAGNOSIS — Z8679 Personal history of other diseases of the circulatory system: Secondary | ICD-10-CM | POA: Insufficient documentation

## 2014-09-01 DIAGNOSIS — M79606 Pain in leg, unspecified: Secondary | ICD-10-CM

## 2014-09-01 DIAGNOSIS — Z862 Personal history of diseases of the blood and blood-forming organs and certain disorders involving the immune mechanism: Secondary | ICD-10-CM | POA: Diagnosis not present

## 2014-09-01 HISTORY — DX: Major depressive disorder, single episode, unspecified: F32.9

## 2014-09-01 HISTORY — DX: Bell's palsy: G51.0

## 2014-09-01 HISTORY — DX: Depression, unspecified: F32.A

## 2014-09-01 HISTORY — DX: Fibromyalgia: M79.7

## 2014-09-01 LAB — BASIC METABOLIC PANEL
Anion gap: 10 (ref 5–15)
BUN: 5 mg/dL — ABNORMAL LOW (ref 6–23)
CO2: 27 mEq/L (ref 19–32)
Calcium: 8.8 mg/dL (ref 8.4–10.5)
Chloride: 105 mEq/L (ref 96–112)
Creatinine, Ser: 0.6 mg/dL (ref 0.50–1.10)
GFR calc Af Amer: >90 mL/min (ref >90)
Glucose, Bld: 96 mg/dL (ref 70–99)
POTASSIUM: 3.9 meq/L (ref 3.7–5.3)
SODIUM: 142 meq/L (ref 137–147)

## 2014-09-01 LAB — CBC WITH DIFFERENTIAL/PLATELET
Basophils Absolute: 0 10*3/uL (ref 0.0–0.1)
Basophils Relative: 0 % (ref 0–1)
Eosinophils Absolute: 0.1 10*3/uL (ref 0.0–0.7)
Eosinophils Relative: 1 % (ref 0–5)
HCT: 31.1 % — ABNORMAL LOW (ref 36.0–46.0)
Hemoglobin: 10.3 g/dL — ABNORMAL LOW (ref 12.0–15.0)
LYMPHS PCT: 39 % (ref 12–46)
Lymphs Abs: 2 10*3/uL (ref 0.7–4.0)
MCH: 27 pg (ref 26.0–34.0)
MCHC: 33.1 g/dL (ref 30.0–36.0)
MCV: 81.4 fL (ref 78.0–100.0)
Monocytes Absolute: 0.6 10*3/uL (ref 0.1–1.0)
Monocytes Relative: 11 % (ref 3–12)
NEUTROS ABS: 2.6 10*3/uL (ref 1.7–7.7)
NEUTROS PCT: 49 % (ref 43–77)
PLATELETS: 260 10*3/uL (ref 150–400)
RBC: 3.82 MIL/uL — AB (ref 3.87–5.11)
RDW: 13.4 % (ref 11.5–15.5)
WBC: 5.3 10*3/uL (ref 4.0–10.5)

## 2014-09-01 NOTE — ED Notes (Signed)
Pt transported to US prior to blood draw

## 2014-09-01 NOTE — ED Notes (Signed)
Pt left and did not notify RN.  Gown found on bed and room empty. Pt left prior to discharge instructions.

## 2014-09-01 NOTE — Discharge Instructions (Signed)

## 2014-09-01 NOTE — ED Notes (Signed)
PT LEFT PRIOR TO DISCHARGE INSTRUCTIONS.

## 2014-09-01 NOTE — ED Provider Notes (Addendum)
CSN: [id number]     Arrival date & time 09/01/14  7829 History   First MD Initiated Contact with Patient 09/01/14 0840     Chief Complaint  Patient presents with  . Leg Pain    right     (Consider location/radiation/quality/duration/timing/severity/associated sxs/prior Treatment) HPI Comments: Patient presents with right leg pain. She's had a 2 to three-day history of some discomfort behind her right knee and now it's radiating to her medial thigh. She describes as a constant throbbing pain. Occasionally goes down into her calf. She denies any known swelling to the area. She denies any history of DVT in the past. She denies any new injuries. She's had some intermittent chest pain to the right side of her chest in her sternal area over the last week. It's not pleuritic and nonexertional. She has not had any chest pain today. She's had no shortness of breath over the last couple of weeks. She was recently diagnosed with Bell's palsy but that seems to have improved.  Patient is a 33 y.o. female presenting with leg pain.  Leg Pain Associated symptoms: no back pain, no fatigue and no fever     Past Medical History  Diagnosis Date  . Anemia   . Anxiety   . Enlarged heart   . Fibromyalgia   . Depression   . Bell's palsy    History reviewed. No pertinent past surgical history. No family history on file. History  Substance Use Topics  . Smoking status: Never Smoker   . Smokeless tobacco: Not on file  . Alcohol Use: No   OB History    No data available     Review of Systems  Constitutional: Negative for fever, chills, diaphoresis and fatigue.  HENT: Negative for congestion, rhinorrhea and sneezing.   Eyes: Negative.   Respiratory: Negative for cough, chest tightness and shortness of breath.   Cardiovascular: Positive for chest pain (none today). Negative for leg swelling.  Gastrointestinal: Negative for nausea, vomiting, abdominal pain, diarrhea and blood in stool.   Genitourinary: Negative for frequency, hematuria, flank pain and difficulty urinating.  Musculoskeletal: Positive for myalgias. Negative for back pain and arthralgias.  Skin: Negative for rash.  Neurological: Negative for dizziness, speech difficulty, weakness, numbness and headaches.      Allergies  Percocet  Home Medications   Prior to Admission medications   Medication Sig Start Date End Date Taking? Authorizing Provider  predniSONE (DELTASONE) 20 MG tablet Take 3 tablets (60 mg total) by mouth daily. 08/21/14  Yes Teressa Lower, NP  valACYclovir (VALTREX) 1000 MG tablet Take 1 tablet (1,000 mg total) by mouth 3 (three) times daily. 08/21/14 09/04/14 Yes Teressa Lower, NP  ALPRAZolam Prudy Feeler) 0.5 MG tablet Take 0.5 mg by mouth at bedtime as needed for anxiety.    Historical Provider, MD  HYDROcodone-acetaminophen (NORCO/VICODIN) 5-325 MG per tablet Take 1-2 tablets by mouth every 6 (six) hours as needed for pain. 07/30/12   Charles B. Bernette Mayers, MD  ibuprofen (ADVIL,MOTRIN) 800 MG tablet Take 1 tablet (800 mg total) by mouth every 8 (eight) hours as needed for pain. 07/30/12   Charles B. Bernette Mayers, MD  IRON PO Take by mouth.    Historical Provider, MD  medroxyPROGESTERone (PROVERA) 5 MG tablet Take 1 tablet (5 mg total) by mouth daily. 07/30/12   Charles B. Bernette Mayers, MD   BP 107/74 mmHg  Pulse 65  Temp(Src) 97.7 F (36.5 C) (Oral)  Resp 16  Ht 5\' 2"  (1.575 m)  Wt 185  lb (83.915 kg)  BMI 33.83 kg/m2  SpO2 100%  LMP 08/26/2014 Physical Exam  Constitutional: She is oriented to person, place, and time. She appears well-developed and well-nourished.  HENT:  Head: Normocephalic and atraumatic.  Eyes: Pupils are equal, round, and reactive to light.  Neck: Normal range of motion. Neck supple.  Cardiovascular: Normal rate, regular rhythm and normal heart sounds.   Pulmonary/Chest: Effort normal and breath sounds normal. No respiratory distress. She has no wheezes. She has no rales.  She exhibits no tenderness.  Abdominal: Soft. Bowel sounds are normal. There is no tenderness. There is no rebound and no guarding.  Musculoskeletal: Normal range of motion. She exhibits no edema.  Positive tenderness to the posterior aspect of the right knee. There is no cyst palpable. There is no bony tenderness to the knee although she has some discomfort to the anterior portion of the knee. The ligaments appear intact. There is some tenderness along the medial thigh. No cords are palpated. She has normal motor function sensation and pulses in the extremity.  Lymphadenopathy:    She has no cervical adenopathy.  Neurological: She is alert and oriented to person, place, and time.  Skin: Skin is warm and dry. No rash noted.  Psychiatric: She has a normal mood and affect.    ED Course  Procedures (including critical care time) Labs Review Labs Reviewed  BASIC METABOLIC PANEL - Abnormal; Notable for the following:    BUN 5 (*)    All other components within normal limits  CBC WITH DIFFERENTIAL - Abnormal; Notable for the following:    RBC 3.82 (*)    Hemoglobin 10.3 (*)    HCT 31.1 (*)    All other components within normal limits    Imaging Review [place] Venous Img Lower Unilateral Right  09/01/2014   CLINICAL DATA:  Right knee pain. The lump in the medial distal thigh.  EXAM: RIGHT LOWER EXTREMITY VENOUS DOPPLER ULTRASOUND  TECHNIQUE: Gray-scale sonography with graded compression, as well as color Doppler and duplex ultrasound, were performed to evaluate the deep venous system from the level of the common femoral vein through the popliteal and proximal calf veins. Spectral Doppler was utilized to evaluate flow at rest and with distal augmentation maneuvers.  COMPARISON:  None.  FINDINGS: Normal compressibility, augmentation and color Doppler flow in the right common femoral vein, right femoral vein and right popliteal vein. Visualized deep right calf veins are patent. The right great saphenous  vein is compressible and patent.  The area of concern in the distal right thigh was evaluated. There is no discrete solid or cystic lesion in this area. There is mild heterogeneity in the soft tissues in this region.  The left common femoral vein is compressible and patent.  IMPRESSION: Negative for right lower extremity deep vein thrombosis.  No discrete abnormality at the area of concern.   Electronically Signed   By: Richarda OverlieAdam  Henn M.D.   On: 09/01/2014 09:53   Dg Knee Complete 4 Views Right  09/01/2014   CLINICAL DATA:  Initial encounter for today history of medial and posterior pain.  EXAM: RIGHT KNEE - COMPLETE 4+ VIEW  COMPARISON:  None.  FINDINGS: There is no evidence of fracture, dislocation, or joint effusion. There is no evidence of arthropathy or other focal bone abnormality. Soft tissues are unremarkable.  IMPRESSION: Negative.   Electronically Signed   By: Kennith CenterEric  Mansell M.D.   On: 09/01/2014 09:58     EKG Interpretation  Date/Time:  Friday September 01 2014 10:09:30 EST Ventricular Rate:  53 PR Interval:  174 QRS Duration: 80 QT Interval:  388 QTC Calculation: 364 R Axis:   67 Text Interpretation:  Sinus bradycardia Otherwise normal ECG since last  tracing no significant change Confirmed by Anjeanette Petzold  MD, Carlyann Placide ([id number]) on  09/01/2014 4:26:24 PM      MDM   Final diagnoses:  Leg pain    Patient has no evidence of DVT. X-rays of the knee are unremarkable. Jamesetta Sohyllis is likely musculoskeletal in nature. She doesn't have any other suggestions of pulmonary embolus. Patient seems to have left the ED prior to discharge instructions given.    Rolan BuccoMelanie Dorothymae Maciver, MD 09/01/14 1230  Rolan BuccoMelanie Cherly Erno, MD 09/01/14 [phone]

## 2014-09-01 NOTE — ED Notes (Signed)
Patient states she developed a knot on the medial posterior right knee two days ago.  States now the pain is in the entire knee and posterior calf, with shooting pains down her anterior thigh.  Patient is also has pain in her right chest five days ago, and told to come to the ed by our call nurse.  Patient states she did not come back and now the pain has resolved.  Patient was seen one week ago and diagnosed with Bells Palsy on left face.

## 2016-04-11 IMAGING — CR DG KNEE COMPLETE 4+V*R*
4 series · 4 of 4 positions shown · non-contrast
Comparison: None.

CLINICAL DATA: Initial encounter for today history of medial and
posterior pain.

EXAM:
RIGHT KNEE - COMPLETE 4+ VIEW

[t knee ap right]
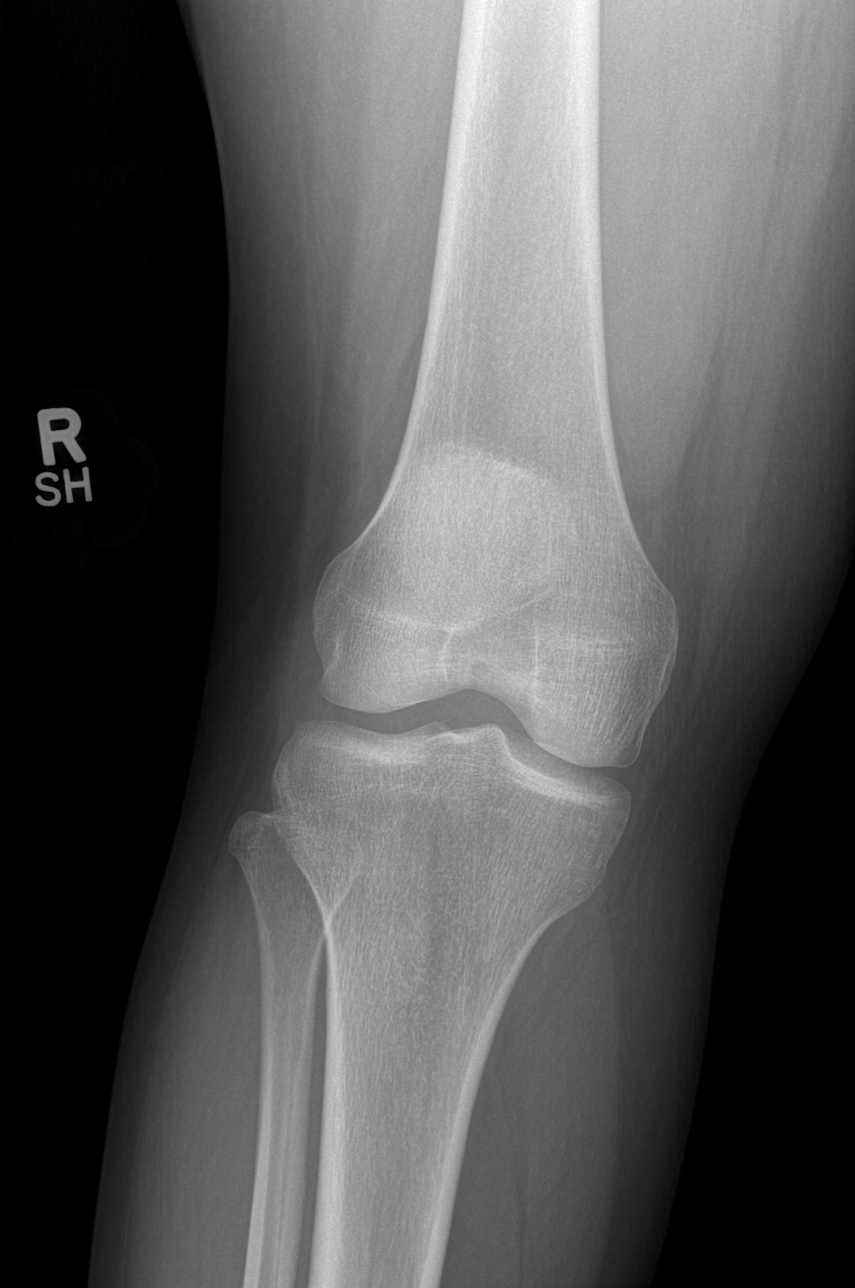

[t knee oblique right (1 of 2)]
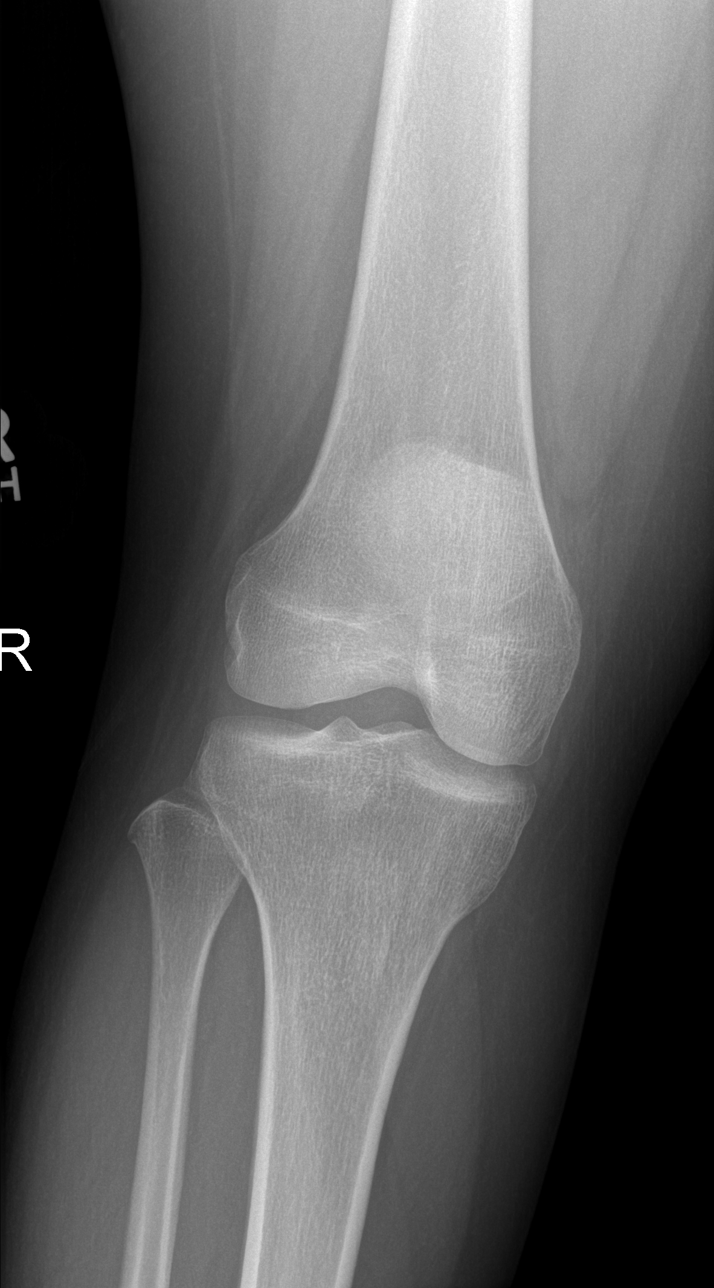

[t knee oblique right (2 of 2)]
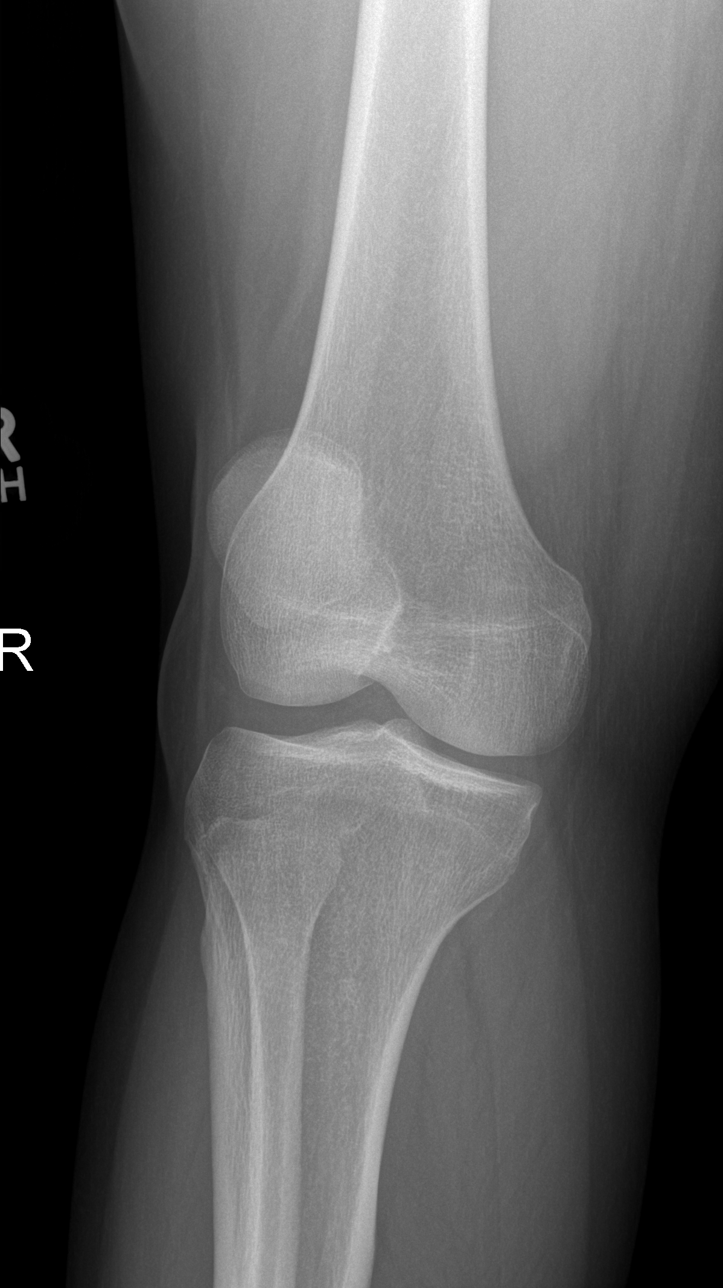

[t knee lat right]
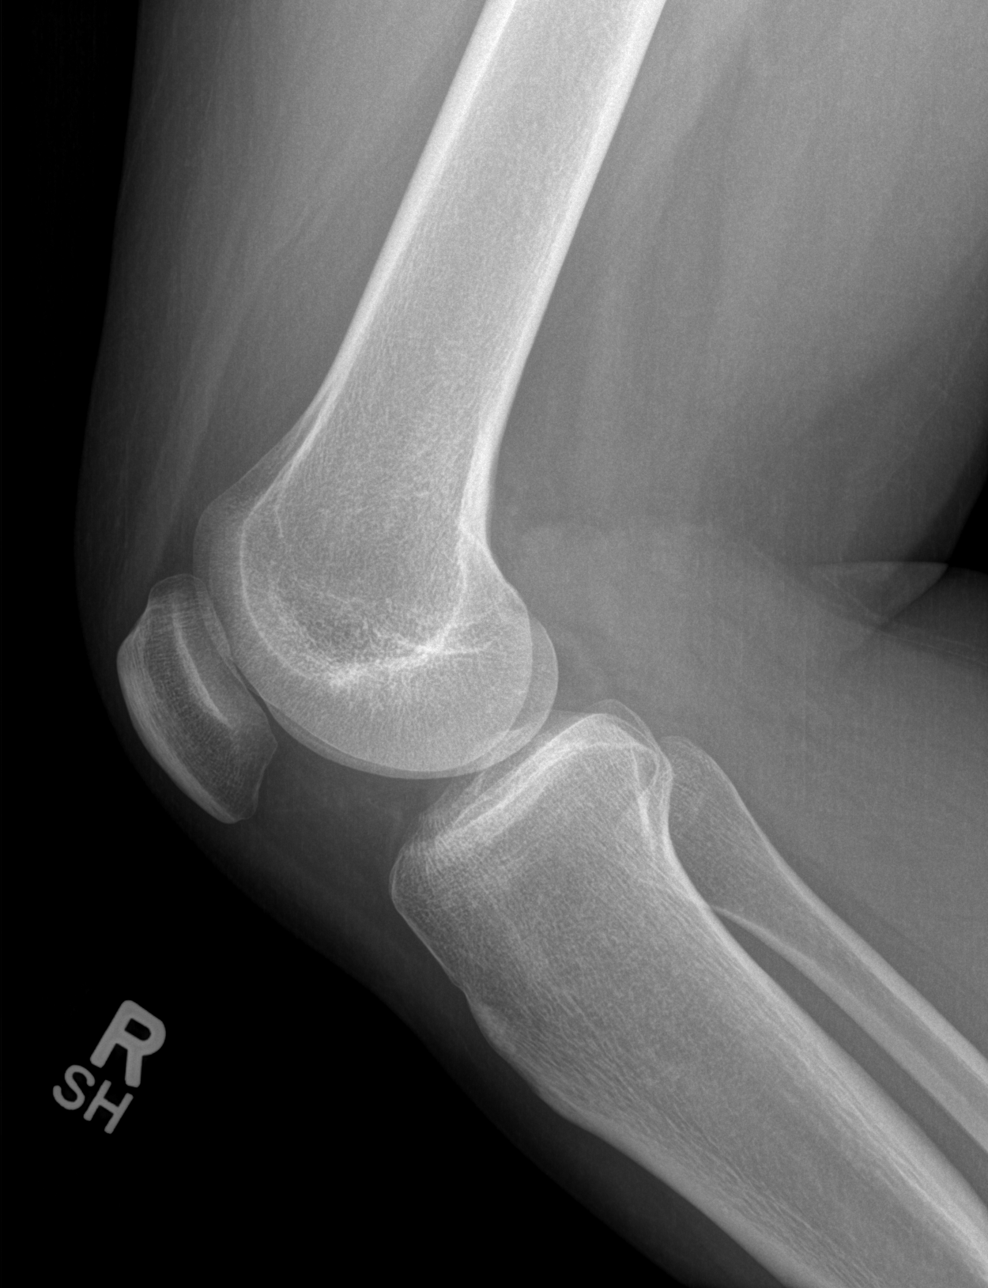

[4 of 4 positions shown; findings below may reference images not displayed]

FINDINGS: There is no evidence of fracture, dislocation, or joint effusion.
There is no evidence of arthropathy or other focal bone abnormality.
Soft tissues are unremarkable.
IMPRESSION: Negative.

## 2019-12-12 ENCOUNTER — Ambulatory Visit: Payer: Medicaid Other | Attending: Internal Medicine

## 2019-12-12 DIAGNOSIS — Z20822 Contact with and (suspected) exposure to covid-19: Secondary | ICD-10-CM

## 2019-12-13 LAB — NOVEL CORONAVIRUS, NAA: SARS-CoV-2, NAA: NOT DETECTED

## 2021-04-09 ENCOUNTER — Other Ambulatory Visit: Payer: Self-pay

## 2021-04-09 ENCOUNTER — Emergency Department (HOSPITAL_BASED_OUTPATIENT_CLINIC_OR_DEPARTMENT_OTHER)
Admission: EM | Admit: 2021-04-09 | Discharge: 2021-04-09 | Disposition: A | Discharge status: Home / Self Care | Payer: Medicaid Other | Attending: Emergency Medicine | Admitting: Emergency Medicine

## 2021-04-09 ENCOUNTER — Encounter (HOSPITAL_BASED_OUTPATIENT_CLINIC_OR_DEPARTMENT_OTHER): Payer: Self-pay

## 2021-04-09 DIAGNOSIS — S8001XA Contusion of right knee, initial encounter: Secondary | ICD-10-CM | POA: Insufficient documentation

## 2021-04-09 DIAGNOSIS — S6992XA Unspecified injury of left wrist, hand and finger(s), initial encounter: Secondary | ICD-10-CM | POA: Insufficient documentation

## 2021-04-09 DIAGNOSIS — M25561 Pain in right knee: Secondary | ICD-10-CM

## 2021-04-09 DIAGNOSIS — W19XXXA Unspecified fall, initial encounter: Secondary | ICD-10-CM

## 2021-04-09 DIAGNOSIS — W010XXA Fall on same level from slipping, tripping and stumbling without subsequent striking against object, initial encounter: Secondary | ICD-10-CM | POA: Diagnosis not present

## 2021-04-09 DIAGNOSIS — S8991XA Unspecified injury of right lower leg, initial encounter: Secondary | ICD-10-CM | POA: Diagnosis present

## 2021-04-09 MED ORDER — KETOROLAC TROMETHAMINE 30 MG/ML IJ SOLN
30.0000 mg | Freq: Once | INTRAMUSCULAR | Status: AC
  Administered 2021-04-09: 30 mg via INTRAMUSCULAR
  Filled 2021-04-09: qty 1

## 2021-04-09 MED ORDER — NAPROXEN 500 MG PO TABS: 500.0000 mg | ORAL_TABLET | Freq: Two times a day (BID) | ORAL | 0 refills | Status: AC

## 2021-04-09 NOTE — ED Provider Notes (Signed)
[facility] EMERGENCY DEPARTMENT Provider Note   CSN: [id number] Arrival date & time: 04/09/21  4098     History Chief Complaint  Patient presents with   Marletta Lor    Joann Peters is a 40 y.o. female.  HPI     This is a 40 year old female with a history of anemia and fibromyalgia who presents with right knee and left hand pain.  Patient reports that she slipped and fell yesterday.  She reports falling backwards.  She reports that her right knee got twisted as did her left hand.  She is rating her pain a 10 out of 10.  She has been ambulatory but with a limp.  She reports pain over the hyperthenar eminence of the left hand and pain in the third and fourth digits.  She has noted swelling.  Denies hitting her head or loss of consciousness.  She took Tylenol for her pain with minimal relief.  She was seen at an outside ED but left prior to evaluation.  She did reportedly have x-rays but does not know the results.  Past Medical History:  Diagnosis Date   Anemia    Anxiety    Bell's palsy    Depression    Enlarged heart    Fibromyalgia     There are no problems to display for this patient.   History reviewed. No pertinent surgical history.   OB History   No obstetric history on file.     No family history on file.  Social History   Tobacco Use   Smoking status: Never   Smokeless tobacco: Never  Substance Use Topics   Alcohol use: No   Drug use: Yes    Types: Marijuana    Home Medications Prior to Admission medications   Medication Sig Start Date End Date Taking? Authorizing Provider  naproxen (NAPROSYN) 500 MG tablet Take 1 tablet (500 mg total) by mouth 2 (two) times daily. 04/09/21  Yes Waynesha Rammel, Mayer Masker, MD  ALPRAZolam Prudy Feeler) 0.5 MG tablet Take 0.5 mg by mouth at bedtime as needed for anxiety.    [provider]  HYDROcodone-acetaminophen (NORCO/VICODIN) 5-325 MG per tablet Take 1-2 tablets by mouth every 6 (six) hours as needed for pain.  07/30/12   Pollyann Savoy, MD  ibuprofen (ADVIL,MOTRIN) 800 MG tablet Take 1 tablet (800 mg total) by mouth every 8 (eight) hours as needed for pain. 07/30/12   Pollyann Savoy, MD  IRON PO Take by mouth.    [provider]  medroxyPROGESTERone (PROVERA) 5 MG tablet Take 1 tablet (5 mg total) by mouth daily. 07/30/12   Pollyann Savoy, MD  predniSONE (DELTASONE) 20 MG tablet Take 3 tablets (60 mg total) by mouth daily. 08/21/14   Teressa Lower, NP    Allergies    Oxycodone-acetaminophen and Percocet [oxycodone-acetaminophen]  Review of Systems   Review of Systems  Musculoskeletal:  Negative for back pain.       Right knee pain, left hand pain  Skin:  Negative for color change.  All other systems reviewed and are negative.  Physical Exam Updated Vital Signs BP (!) 129/92   Pulse (!) 52   Temp 98.2 F (36.8 C) (Oral)   Resp 18   Ht 1.575 m (5\' 2" )   Wt 83.9 kg   SpO2 99%   BMI 33.84 kg/m   Physical Exam Vitals and nursing note reviewed.  Constitutional:      Appearance: She is well-developed. She is obese. She  is not ill-appearing.  HENT:     Head: Normocephalic and atraumatic.     Nose: Nose normal.     Mouth/Throat:     Mouth: Mucous membranes are moist.  Eyes:     Pupils: Pupils are equal, round, and reactive to light.  Cardiovascular:     Rate and Rhythm: Normal rate and regular rhythm.     Heart sounds: Normal heart sounds.  Pulmonary:     Effort: Pulmonary effort is normal. No respiratory distress.     Breath sounds: No wheezing.  Abdominal:     Palpations: Abdomen is soft.  Musculoskeletal:     Cervical back: Neck supple.     Comments: Tenderness palpation of the anterior right knee with noted contusion and abrasion, normal range of motion, no gross deformities, no significant joint laxity, patient is able to fire quadriceps, no deformity at the patella Focused exam of the left hand with tenderness palpation over the hypothenar eminence  without obvious bruising, there is swelling between the MCP and PIP joints of the third and fourth digits, flexion and extension intact, 2+ radial pulse  Skin:    General: Skin is warm and dry.  Neurological:     Mental Status: She is alert and oriented to person, place, and time.  Psychiatric:        Mood and Affect: Mood normal.    ED Results / Procedures / Treatments   Labs (all labs ordered are listed, but only abnormal results are displayed) Labs Reviewed - No data to display  EKG None  Radiology No results found.  Procedures Procedures   Medications Ordered in ED Medications  ketorolac (TORADOL) 30 MG/ML injection 30 mg (has no administration in time range)    ED Course  I have reviewed the triage vital signs and the nursing notes.  Pertinent labs & imaging results that were available during my care of the patient were reviewed by me and considered in my medical decision making (see chart for details).    MDM Rules/Calculators/A&P                          Patient presents following a fall over 12 hours ago.  She is nontoxic and vital signs are reassuring.  Her exam is fairly reassuring.  She has an abrasion and contusion to the left knee.  Additionally she has some tenderness and swelling of the left hand.  I have reviewed outside reports of x-rays including the left hand and her right knee.  Both are negative for acute fracture and/or dislocation.  I suspect she jammed her fingers on the left hand given the swelling between the interphalangeal joints.  Recommend buddy tape.  Additionally, she has a contusion over the knee which could explain her pain.  However, full rule out of meniscus or ligamentous injury given description of fall cannot be excluded.  For this reason, patient was given a knee immobilizer.  She will be given sports medicine follow-up.  Recommend naproxen twice daily for pain or discomfort.  Ice to affected extremities.  After history, exam, and medical  workup I feel the patient has been appropriately medically screened and is safe for discharge home. Pertinent diagnoses were discussed with the patient. Patient was given return precautions.  Final Clinical Impression(s) / ED Diagnoses Final diagnoses:  Fall, initial encounter  Acute pain of right knee  Jammed interphalangeal joint of finger of left hand, initial encounter    Rx /  DC Orders ED Discharge Orders          Ordered    naproxen (NAPROSYN) 500 MG tablet  2 times daily        04/09/21 0631             Fareed Fung, Mayer Masker, MD 04/09/21 [phone]

## 2021-04-09 NOTE — ED Triage Notes (Addendum)
Pt arrives with c/o fall yesterday around noon injuring left hand and right knee. Seen at [facility] last night and received XR's but no results. Ambulatory to room.

## 2021-04-09 NOTE — Discharge Instructions (Addendum)
You were seen today after a fall.  Your x-rays reports were reviewed from the outside hospital.  You do not have any evidence of fractures or dislocations.  You may have disrupted some ligaments in the right knee.  Additionally you may have "jammed" the joints of your left finger.  You may buddy tape your fingers together for comfort.  Apply ice.  Take NSAIDs as directed.

## 2021-04-10 ENCOUNTER — Ambulatory Visit: Payer: Medicaid Other | Admitting: Family Medicine

## 2021-04-10 NOTE — Progress Notes (Deleted)
  Joann Peters - 40 y.o. female MRN [id number]  Date of birth: December 10, 1980  SUBJECTIVE:  Including CC & ROS.  No chief complaint on file.   Joann Peters is a 40 y.o. female that is  ***.  ***   Review of Systems See HPI   HISTORY: Past Medical, Surgical, Social, and Family History Reviewed & Updated per EMR.   Pertinent Historical Findings include:  Past Medical History:  Diagnosis Date   Anemia    Anxiety    Bell's palsy    Depression    Enlarged heart    Fibromyalgia     No past surgical history on file.  No family history on file.  Social History   Socioeconomic History   Marital status: Single    Spouse name: Not on file   Number of children: Not on file   Years of education: Not on file   Highest education level: Not on file  Occupational History   Not on file  Tobacco Use   Smoking status: Never   Smokeless tobacco: Never  Substance and Sexual Activity   Alcohol use: No   Drug use: Yes    Types: Marijuana   Sexual activity: Yes    Birth control/protection: None  Other Topics Concern   Not on file  Social History Narrative   Not on file   Social Determinants of Health   Financial Resource Strain: Not on file  Food Insecurity: Not on file  Transportation Needs: Not on file  Physical Activity: Not on file  Stress: Not on file  Social Connections: Not on file  Intimate Partner Violence: Not on file     PHYSICAL EXAM:  VS: There were no vitals taken for this visit. Physical Exam Gen: NAD, alert, cooperative with exam, well-appearing MSK:  ***      ASSESSMENT & PLAN:   No problem-specific Assessment & Plan notes found for this encounter.

## 2021-04-11 ENCOUNTER — Other Ambulatory Visit: Payer: Self-pay

## 2021-04-11 ENCOUNTER — Encounter: Payer: Self-pay | Admitting: Family Medicine

## 2021-04-11 ENCOUNTER — Ambulatory Visit (INDEPENDENT_AMBULATORY_CARE_PROVIDER_SITE_OTHER): Payer: Medicaid Other | Admitting: Family Medicine

## 2021-04-11 ENCOUNTER — Ambulatory Visit: Payer: Self-pay

## 2021-04-11 VITALS — BP 120/72 | Ht 62.0 in | Wt 185.0 lb

## 2021-04-11 DIAGNOSIS — S8001XA Contusion of right knee, initial encounter: Secondary | ICD-10-CM | POA: Insufficient documentation

## 2021-04-11 DIAGNOSIS — M25561 Pain in right knee: Secondary | ICD-10-CM

## 2021-04-11 DIAGNOSIS — S63635A Sprain of interphalangeal joint of left ring finger, initial encounter: Secondary | ICD-10-CM | POA: Diagnosis not present

## 2021-04-11 NOTE — Assessment & Plan Note (Signed)
Symptoms most consistent with a sprain of the PIP joint. -Counseled on home exercise therapy and supportive care. -Counseled on buddy taping.

## 2021-04-11 NOTE — Progress Notes (Signed)
  Joann Peters - 40 y.o. female MRN [id number]  Date of birth: [birth year]  SUBJECTIVE:  Including CC & ROS.  No chief complaint on file.   Joann Peters is a 40 y.o. female that is presenting with right knee pain and left ring finger pain.  She had a fall this past Monday where she landed with her knee in flexion.  Has had pain in the anterior aspect since that time.  Also having pain of the PIP joint of the left ring finger.  She has limited range of motion.   Review of Systems See HPI   HISTORY: Past Medical, Surgical, Social, and Family History Reviewed & Updated per EMR.   Pertinent Historical Findings include:  Past Medical History:  Diagnosis Date   Anemia    Anxiety    Bell's palsy    Depression    Enlarged heart    Fibromyalgia     History reviewed. No pertinent surgical history.  History reviewed. No pertinent family history.  Social History   Socioeconomic History   Marital status: Single    Spouse name: Not on file   Number of children: Not on file   Years of education: Not on file   Highest education level: Not on file  Occupational History   Not on file  Tobacco Use   Smoking status: Never   Smokeless tobacco: Never  Substance and Sexual Activity   Alcohol use: No   Drug use: Yes    Types: Marijuana   Sexual activity: Yes    Birth control/protection: None  Other Topics Concern   Not on file  Social History Narrative   Not on file   Social Determinants of Health   Financial Resource Strain: Not on file  Food Insecurity: Not on file  Transportation Needs: Not on file  Physical Activity: Not on file  Stress: Not on file  Social Connections: Not on file  Intimate Partner Violence: Not on file     PHYSICAL EXAM:  VS: BP 120/72 (BP Location: Left Arm, Patient Position: Sitting, Cuff Size: Large)   Ht 5\' 2"  (1.575 m)   Wt 185 lb (83.9 kg)   BMI 33.84 kg/m  Physical Exam Gen: NAD, alert, cooperative with exam, well-appearing MSK:  Right  knee: No obvious effusion. Normal range of motion. Normal strength resistance. Neurovascular intact  Limited ultrasound: Right knee, left ring finger:  Right knee: No obvious effusion. Normal-appearing quadricep patellar tendon. Normal-appearing medial meniscus and lateral meniscus.  Left ring finger: No changes at the MCP joint. Normal-appearing PIP joint. No changes of the flexor tendon.  Summary: No structural changes appreciated  Ultrasound and interpretation by , MD    ASSESSMENT & PLAN:   Sprain of interphalangeal joint of left ring finger Symptoms most consistent with a sprain of the PIP joint. -Counseled on home exercise therapy and supportive care. -Counseled on buddy taping.  Contusion of right knee No structural changes appreciated on ultrasound.  Her injury was on 6/27. -Counseled on home exercise therapy and supportive care. -Hinged knee brace. -Could consider physical therapy or injection.

## 2021-04-11 NOTE — Assessment & Plan Note (Signed)
No structural changes appreciated on ultrasound.  Her injury was on 6/27. -Counseled on home exercise therapy and supportive care. -Hinged knee brace. -Could consider physical therapy or injection.

## 2021-04-11 NOTE — Patient Instructions (Signed)
Nice to meet you Please try the exercises  Please try ice  Please try the brace for the knee  Please try buddy taping for the fingers   Please send me a message in MyChart with any questions or updates.  Please see me back in 2 weeks.   --Dr. Jordan Likes

## 2021-04-25 ENCOUNTER — Ambulatory Visit: Payer: Medicaid Other | Admitting: Family Medicine

## 2021-04-25 NOTE — Progress Notes (Deleted)
  Joann Peters - 40 y.o. female MRN [id number]  Date of birth: 04/01/1981  SUBJECTIVE:  Including CC & ROS.  No chief complaint on file.   Joann Peters is a 40 y.o. female that is  ***.  ***   Review of Systems See HPI   HISTORY: Past Medical, Surgical, Social, and Family History Reviewed & Updated per EMR.   Pertinent Historical Findings include:  Past Medical History:  Diagnosis Date   Anemia    Anxiety    Bell's palsy    Depression    Enlarged heart    Fibromyalgia     No past surgical history on file.  No family history on file.  Social History   Socioeconomic History   Marital status: Single    Spouse name: Not on file   Number of children: Not on file   Years of education: Not on file   Highest education level: Not on file  Occupational History   Not on file  Tobacco Use   Smoking status: Never   Smokeless tobacco: Never  Substance and Sexual Activity   Alcohol use: No   Drug use: Yes    Types: Marijuana   Sexual activity: Yes    Birth control/protection: None  Other Topics Concern   Not on file  Social History Narrative   Not on file   Social Determinants of Health   Financial Resource Strain: Not on file  Food Insecurity: Not on file  Transportation Needs: Not on file  Physical Activity: Not on file  Stress: Not on file  Social Connections: Not on file  Intimate Partner Violence: Not on file     PHYSICAL EXAM:  VS: There were no vitals taken for this visit. Physical Exam Gen: NAD, alert, cooperative with exam, well-appearing MSK:  ***      ASSESSMENT & PLAN:   No problem-specific Assessment & Plan notes found for this encounter.     

## 2023-01-26 ENCOUNTER — Encounter: Payer: Self-pay | Admitting: *Deleted
